# Patient Record
Sex: Male | Born: 2005 | Race: White | Hispanic: No | Marital: Single | Smoking: Never smoker
Health system: Southern US, Community
[De-identification: ages and names within clinical notes are randomized; demographics above are authoritative.]

## PROBLEM LIST (undated history)

## (undated) HISTORY — PX: APPENDECTOMY: SHX54

## (undated) HISTORY — PX: CYST REMOVAL TRUNK: SHX6283

## (undated) HISTORY — PX: ADENOIDECTOMY: SUR15

---
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
## 2023-02-20 ENCOUNTER — Ambulatory Visit: Payer: Medicaid Other | Admitting: Dermatology

## 2023-04-04 ENCOUNTER — Ambulatory Visit: Payer: Medicaid Other | Admitting: Dermatology

## 2023-04-04 ENCOUNTER — Ambulatory Visit (INDEPENDENT_AMBULATORY_CARE_PROVIDER_SITE_OTHER): Payer: Medicaid Other | Admitting: Dermatology

## 2023-04-04 VITALS — BP 144/86 | HR 76

## 2023-04-04 DIAGNOSIS — Z7189 Other specified counseling: Secondary | ICD-10-CM

## 2023-04-04 DIAGNOSIS — L700 Acne vulgaris: Secondary | ICD-10-CM

## 2023-04-04 DIAGNOSIS — Z79899 Other long term (current) drug therapy: Secondary | ICD-10-CM | POA: Diagnosis not present

## 2023-04-04 MED ORDER — ISOTRETINOIN 30 MG PO CAPS: 30.0000 mg | ORAL_CAPSULE | Freq: Every day | ORAL | 0 refills | Status: DC

## 2023-04-04 NOTE — Patient Instructions (Signed)
Due to recent changes in healthcare laws, you may see results of your pathology and/or laboratory studies on MyChart before the doctors have had a chance to review them. We understand that in some cases there may be results that are confusing or concerning to you. Please understand that not all results are received at the same time and often the doctors may need to interpret multiple results in order to provide you with the best plan of care or course of treatment. Therefore, we ask that you please give Jacob Simon 2 business days to thoroughly review all your results before contacting the office for clarification. Should we see a critical lab result, you will be contacted sooner.   If You Need Anything After Your Visit  If you have any questions or concerns for your doctor, please call our main line at [phone] and press option 4 to reach your doctor's medical assistant. If no one answers, please leave a voicemail as directed and we will return your call as soon as possible. Messages left after 4 pm will be answered the following business day.   You may also send Jacob Simon a message via MyChart. We typically respond to MyChart messages within 1-2 business days.  For prescription refills, please ask your pharmacy to contact our office. Our fax number is [phone].  If you have an urgent issue when the clinic is closed that cannot wait until the next business day, you can page your doctor at the number below.    Please note that while we do our best to be available for urgent issues outside of office hours, we are not available 24/7.   If you have an urgent issue and are unable to reach Jacob Simon, you may choose to seek medical care at your doctor's office, retail clinic, urgent care center, or emergency room.  If you have a medical emergency, please immediately call 911 or go to the emergency department.  Pager Numbers  - Dr. Gwen Pounds: [phone]  - Dr. Roseanne Reno: [phone]  In the event of inclement  weather, please call our main line at [phone] for an update on the status of any delays or closures.  Dermatology Medication Tips: Please keep the boxes that topical medications come in in order to help keep track of the instructions about where and how to use these. Pharmacies typically print the medication instructions only on the boxes and not directly on the medication tubes.   If your medication is too expensive, please contact our office at [phone] option 4 or send Jacob Simon a message through MyChart.   We are unable to tell what your co-pay for medications will be in advance as this is different depending on your insurance coverage. However, we may be able to find a substitute medication at lower cost or fill out paperwork to get insurance to cover a needed medication.   If a prior authorization is required to get your medication covered by your insurance company, please allow Jacob Simon 1-2 business days to complete this process.  Drug prices often vary depending on where the prescription is filled and some pharmacies may offer cheaper prices.  The website www.goodrx.com contains coupons for medications through different pharmacies. The prices here do not account for what the cost may be with help from insurance (it may be cheaper with your insurance), but the website can give you the price if you did not use any insurance.  - You can print the associated coupon and take it with your prescription to the pharmacy.  -  You may also stop by our office during regular business hours and pick up a GoodRx coupon card.  - If you need your prescription sent electronically to a different pharmacy, notify our office through [facility] or by phone at [phone] option 4.     Si Usted Necesita Algo Despus de Su Visita  Tambin puede enviarnos un mensaje a travs de Clinical cytogeneticist. Por lo general respondemos a los mensajes de MyChart en el transcurso de 1 a 2 das hbiles.  Para renovar recetas,  por favor pida a su farmacia que se ponga en contacto con nuestra oficina. Annie Sable de fax es [place] [phone].  Si tiene un asunto urgente cuando la clnica est cerrada y que no puede esperar hasta el siguiente da hbil, puede llamar/localizar a su doctor(a) al nmero que aparece a continuacin.   Por favor, tenga en cuenta que aunque hacemos todo lo posible para estar disponibles para asuntos urgentes fuera del horario de [place], no estamos disponibles las 24 horas del da, los 7 [address]  [address] de la [place].   Si tiene un problema urgente y no puede comunicarse con nosotros, puede optar por buscar atencin mdica  en el consultorio de su doctor(a), en una clnica privada, en un centro de atencin urgente o en una sala de emergencias.  Si tiene Engineer, drilling, por favor llame inmediatamente al 911 o vaya a la sala de emergencias.  Nmeros de bper  - Dr. Gwen Pounds: [phone]  - Dra. Moye: [phone]  - Dra. Roseanne Reno: [phone]  En caso de inclemencias del [place], por favor llame a Lacy Duverney principal al [phone] para una actualizacin sobre el [place] de cualquier retraso o cierre.  Consejos para la medicacin en dermatologa: Por favor, guarde las cajas en las que vienen los medicamentos de uso tpico para ayudarle a seguir las instrucciones sobre dnde y cmo usarlos. Las farmacias generalmente imprimen las instrucciones del medicamento slo en las cajas y no directamente en los tubos del [place].   Si su medicamento es muy caro, por favor, pngase en contacto con Rolm Gala llamando al [phone] y presione la opcin 4 o envenos un mensaje a travs de Clinical cytogeneticist.   No podemos decirle cul ser su copago por los medicamentos por adelantado ya que esto es diferente dependiendo de la cobertura de su seguro. Sin embargo, es posible que podamos encontrar un medicamento sustituto a Audiological scientist un formulario para que el seguro cubra el medicamento que se  considera necesario.   Si se requiere una autorizacin previa para que su compaa de seguros [place] su medicamento, por favor permtanos de 1 a 2 das hbiles para completar [address]  Los precios de los medicamentos varan con frecuencia dependiendo del Environmental consultant de dnde se surte la receta y alguna farmacias pueden ofrecer precios ms baratos.  El sitio web www.goodrx.com tiene cupones para medicamentos de Health and safety inspector. Los precios aqu no tienen en cuenta lo que podra costar con la ayuda del seguro (puede ser ms barato con su seguro), pero el sitio web puede darle el precio si no utiliz Tourist information centre manager.  - Puede imprimir el cupn correspondiente y llevarlo con su receta a la farmacia.  - Tambin puede pasar por nuestra oficina durante el horario de atencin regular y Education officer, museum una tarjeta de cupones de GoodRx.  - Si necesita que su receta se enve electrnicamente a Psychiatrist, informe a nuestra oficina a travs de MyChart de [place] o por telfono llamando al [phone] y  presione la opcin 4.

## 2023-04-04 NOTE — Progress Notes (Signed)
   Follow-Up Visit   Subjective  Jacob Simon is a 17 y.o. male who presents for the following: Acne Vulgaris - patient completed one month of Isotretinoin, but wasn't able to get his last prescription from [facility] so he has been off of the medication for about 5 months. He is here today to restart treatment for acne.    The following portions of the chart were reviewed this encounter and updated as appropriate: medications, allergies, medical history  Review of Systems:  No other skin or systemic complaints except as noted in HPI or Assessment and Plan.  Objective  Well appearing patient in no apparent distress; mood and affect are within normal limits.  Areas Examined: Face, chest and back  Relevant exam findings are noted in the Assessment and Plan.   Assessment & Plan   Acne vulgaris  Related Medications ISOtretinoin (ACCUTANE) 30 MG capsule Take 1 capsule (30 mg total) by mouth daily. Take with food   ACNE VULGARIS - completed 4 wks of Isotretinoin  Exam: Papules and comedones on the face, moderate papules on the back.   Chronic and persistent condition with duration or expected duration over one year. Condition is symptomatic/ bothersome to patient. Not currently at goal.   Treatment Plan: Restart Isotretinoin 30 mg po QD. Acne is Severe; chronic and persistent; not at goal. Patient is on Isotretinoin -  requiring FDA mandated monthly evaluations and laboratory monitoring.  While taking isotretinoin, do not share pills and do not donate blood. Generic isotretinoin is best absorbed when taken with a fatty meal. Isotretinoin can make you sensitive to the sun. Daily careful sun protection including sunscreen SPF 30+ when outdoors is recommended.  Return in about 1 month (around 05/05/2023).  Maylene Roes, CMA, am acting as scribe for Armida Sans, MD .  Documentation: I have reviewed the above documentation for accuracy and completeness, and I agree with the  above.  Armida Sans, MD

## 2023-04-10 ENCOUNTER — Encounter: Payer: Self-pay | Admitting: Dermatology

## 2023-05-08 ENCOUNTER — Ambulatory Visit: Payer: Medicaid Other | Admitting: Dermatology

## 2023-05-14 ENCOUNTER — Encounter: Payer: Self-pay | Admitting: Dermatology

## 2023-05-14 ENCOUNTER — Ambulatory Visit (INDEPENDENT_AMBULATORY_CARE_PROVIDER_SITE_OTHER): Payer: Medicaid Other | Admitting: Dermatology

## 2023-05-14 VITALS — BP 105/65 | HR 91 | Wt 160.0 lb

## 2023-05-14 DIAGNOSIS — L853 Xerosis cutis: Secondary | ICD-10-CM

## 2023-05-14 DIAGNOSIS — Z79899 Other long term (current) drug therapy: Secondary | ICD-10-CM

## 2023-05-14 DIAGNOSIS — L700 Acne vulgaris: Secondary | ICD-10-CM | POA: Diagnosis not present

## 2023-05-14 DIAGNOSIS — K130 Diseases of lips: Secondary | ICD-10-CM | POA: Diagnosis not present

## 2023-05-14 MED ORDER — ISOTRETINOIN 30 MG PO CAPS: ORAL_CAPSULE | ORAL | 0 refills | Status: DC

## 2023-05-14 NOTE — Progress Notes (Signed)
Isotretinoin Follow-Up Visit   Subjective  Jacob Simon is a 17 y.o. male who presents for the following: Isotretinoin follow-up - patient reports nose bleeds, dry skin, and chapped but no other side effects such as mood changes, depression, homicidal or suicidal thoughts.  Week # 8    Isotretinoin F/U - 05/14/23 1600       Isotretinoin Follow Up   iPledge # [id number]    Date 05/14/23    Weight 160 lb (72.6 kg)    Acne breakouts since last visit? Yes      Dosage   Target Dosage (mg) 10890    Current (To Date) Dosage (mg) 1500    To Go Dosage (mg) 9390      Skin Side Effects   Dry Lips Yes    Nose bleeds Yes    Dry eyes No    Dry Skin Yes    Sunburn No      Gastrointestinal Side Effects   Nausea No    Diarrhea No    Blood in stool No      Neurological Side Effects   Blurred vision No    Depression No    Headache No    Homicidal thoughts No    Mood Changes No    Suicidal thoughts No      Constitutional Side Effects   Fatigue No      Musculoskeletal Side Effects   Muscle aches No              Side effects: Dry skin, dry lips  The following portions of the chart were reviewed this encounter and updated as appropriate: medications, allergies, medical history  Review of Systems:  No other skin or systemic complaints except as noted in HPI or Assessment and Plan.  Objective  Well appearing patient in no apparent distress; mood and affect are within normal limits.  An examination of the face, neck, chest, and back was performed and relevant findings are noted below.     Assessment & Plan     ACNE VULGARIS Patient is currently on Isotretinoin requiring FDA mandated monthly evaluations and laboratory monitoring. Condition is currently not to goal (must reach target dose based on weight and also have clear skin for 2 months prior to discontinuation in order to help prevent relapse)  Exam findings: inflamed papules and pitted scars on cheeks.  Inflamed papules on upper back  Week # 8 Pharmacy [place], [place] #[id number]  Total mg -  1,500 Total mg/kg - 20.6  Increase Isotretinoin to 30mg  2 po QD.  Using aquaphor for epistaxis, cerave for moisturization Patient confirmed in iPledge and isotretinoin sent to pharmacy.  Patient will call us if side effects worsen  Xerosis secondary to isotretinoin therapy - Continue emollients as directed - Xyzal (levocetirizine) once a day and fish oil 1 gram daily may also help with dryness   Cheilitis secondary to isotretinoin therapy - Continue lip balm as directed, Dr. Clayborne Artist Cortibalm recommended   Long term medication management (isotretinoin).  Patient is using long term (months to years) prescription medication to control their dermatologic condition.  These medications require periodic monitoring to evaluate for efficacy and side effects and may require periodic laboratory monitoring.  - While taking Isotretinoin and for 30 days after you finish the medication, do not share pills, do not donate blood. It is very important that a women who could become pregnant not take this medicine or get a blood transfusion with this medicine in  it. Isotretinoin is best absorbed when taken with a fatty meal. Isotretinoin can make you sensitive to the sun. Daily careful sun protection including sunscreen SPF 30+ when outdoors is recommended.  Follow-up in 30 days.  Maylene Roes, CMA, am acting as scribe for Elie Goody, MD .  Documentation: I have reviewed the above documentation for accuracy and completeness, and I agree with the above.  Elie Goody, MD

## 2023-05-14 NOTE — Patient Instructions (Signed)

## 2023-06-14 ENCOUNTER — Ambulatory Visit: Payer: Medicaid Other | Admitting: Dermatology

## 2023-06-14 ENCOUNTER — Encounter: Payer: Self-pay | Admitting: Dermatology

## 2023-06-14 VITALS — Wt 160.0 lb

## 2023-06-14 DIAGNOSIS — L853 Xerosis cutis: Secondary | ICD-10-CM | POA: Diagnosis not present

## 2023-06-14 DIAGNOSIS — K130 Diseases of lips: Secondary | ICD-10-CM

## 2023-06-14 DIAGNOSIS — L700 Acne vulgaris: Secondary | ICD-10-CM | POA: Diagnosis not present

## 2023-06-14 DIAGNOSIS — Z79899 Other long term (current) drug therapy: Secondary | ICD-10-CM

## 2023-06-14 DIAGNOSIS — Z7189 Other specified counseling: Secondary | ICD-10-CM

## 2023-06-14 DIAGNOSIS — L237 Allergic contact dermatitis due to plants, except food: Secondary | ICD-10-CM | POA: Diagnosis not present

## 2023-06-14 MED ORDER — ISOTRETINOIN 30 MG PO CAPS: ORAL_CAPSULE | ORAL | 0 refills | Status: DC

## 2023-06-14 MED ORDER — TRIAMCINOLONE ACETONIDE 0.1 % EX CREA: TOPICAL_CREAM | CUTANEOUS | 0 refills | Status: AC

## 2023-06-14 NOTE — Patient Instructions (Addendum)
While taking isotretinoin, do not share pills and do not donate blood. Generic isotretinoin is best absorbed when taken with a fatty meal. Isotretinoin can make you sensitive to the sun. Daily careful sun protection including sunscreen SPF 30+ when outdoors is recommended.  For rash  Start triamcinolone 0.1 % cream - apply topically thin layer to rash areas twice daily as needed until clear    Topical steroids (such as triamcinolone, fluocinolone, fluocinonide, mometasone, clobetasol, halobetasol, betamethasone, hydrocortisone) can cause thinning and lightening of the skin if they are used for too long in the same area. Your physician has selected the right strength medicine for your problem and area affected on the body. Please use your medication only as directed by your physician to prevent side effects.     Due to recent changes in healthcare laws, you may see results of your pathology and/or laboratory studies on MyChart before the doctors have had a chance to review them. We understand that in some cases there may be results that are confusing or concerning to you. Please understand that not all results are received at the same time and often the doctors may need to interpret multiple results in order to provide you with the best plan of care or course of treatment. Therefore, we ask that you please give [place] 2 business days to thoroughly review all your results before contacting the office for clarification. Should we see a critical lab result, you will be contacted sooner.   If You Need Anything After Your Visit  If you have any questions or concerns for your doctor, please call our main line at [phone] and press option 4 to reach your doctor's medical assistant. If no one answers, please leave a voicemail as directed and we will return your call as soon as possible. Messages left after 4 pm will be answered the following business day.   You may also send [place] a message via MyChart. We  typically respond to MyChart messages within 1-2 business days.  For prescription refills, please ask your pharmacy to contact our office. Our fax number is [phone].  If you have an urgent issue when the clinic is closed that cannot wait until the next business day, you can page your doctor at the number below.    Please note that while we do our best to be available for urgent issues outside of office hours, we are not available 24/7.   If you have an urgent issue and are unable to reach [place], you may choose to seek medical care at your doctor's office, retail clinic, urgent care center, or emergency room.  If you have a medical emergency, please immediately call 911 or go to the emergency department.  Pager Numbers  - Dr. Gwen Pounds: [phone]  - Dr. Roseanne Reno: [phone]  - Dr. Katrinka Blazing: [phone]   In the event of inclement weather, please call our main line at [phone] for an update on the status of any delays or closures.  Dermatology Medication Tips: Please keep the boxes that topical medications come in in order to help keep track of the instructions about where and how to use these. Pharmacies typically print the medication instructions only on the boxes and not directly on the medication tubes.   If your medication is too expensive, please contact our office at [phone] option 4 or send [place] a message through MyChart.   We are unable to tell what your co-pay for medications will be in advance as this is different depending  on your insurance coverage. However, we may be able to find a substitute medication at lower cost or fill out paperwork to get insurance to cover a needed medication.   If a prior authorization is required to get your medication covered by your insurance company, please allow [place] 1-2 business days to complete this process.  Drug prices often vary depending on where the prescription is filled and some pharmacies may offer cheaper prices.  The  website www.goodrx.com contains coupons for medications through different pharmacies. The prices here do not account for what the cost may be with help from insurance (it may be cheaper with your insurance), but the website can give you the price if you did not use any insurance.  - You can print the associated coupon and take it with your prescription to the pharmacy.  - You may also stop by our office during regular business hours and pick up a GoodRx coupon card.  - If you need your prescription sent electronically to a different pharmacy, notify our office through [facility] or by phone at [phone] option 4.     Si Usted Necesita Algo Despus de Su Visita  Tambin puede enviarnos un mensaje a travs de Clinical cytogeneticist. Por lo general respondemos a los mensajes de MyChart en el transcurso de 1 a 2 das hbiles.  Para renovar recetas, por favor pida a su farmacia que se ponga en contacto con nuestra oficina. Annie Sable de fax es [place] [phone].  Si tiene un asunto urgente cuando la clnica est cerrada y que no puede esperar hasta el siguiente da hbil, puede llamar/localizar a su doctor(a) al nmero que aparece a continuacin.   Por favor, tenga en cuenta que aunque hacemos todo lo posible para estar disponibles para asuntos urgentes fuera del horario de [place], no estamos disponibles las 24 horas del da, los 7 [address]  [address] de la [place].   Si tiene un problema urgente y no puede comunicarse con nosotros, puede optar por buscar atencin mdica  en el consultorio de su doctor(a), en una clnica privada, en un centro de atencin urgente o en una sala de emergencias.  Si tiene Engineer, drilling, por favor llame inmediatamente al 911 o vaya a la sala de emergencias.  Nmeros de bper  - Dr. Gwen Pounds: [phone]  - Dra. Roseanne Reno: [phone]  - Dr. Katrinka Blazing: [phone]   En caso de inclemencias del tiempo, por favor llame a Lacy Duverney principal al [phone] para una  actualizacin sobre el [place] de cualquier retraso o cierre.  Consejos para la medicacin en dermatologa: Por favor, guarde las cajas en las que vienen los medicamentos de uso tpico para ayudarle a seguir las instrucciones sobre dnde y cmo usarlos. Las farmacias generalmente imprimen las instrucciones del medicamento slo en las cajas y no directamente en los tubos del [place].   Si su medicamento es muy caro, por favor, pngase en contacto con Rolm Gala llamando al [phone] y presione la opcin 4 o envenos un mensaje a travs de Clinical cytogeneticist.   No podemos decirle cul ser su copago por los medicamentos por adelantado ya que esto es diferente dependiendo de la cobertura de su seguro. Sin embargo, es posible que podamos encontrar un medicamento sustituto a Audiological scientist un formulario para que el seguro cubra el medicamento que se considera necesario.   Si se requiere una autorizacin previa para que su compaa de seguros [place] su medicamento, por favor permtanos de 1 a 2 das hbiles para [employer]  proceso.  Los precios de los medicamentos varan con frecuencia dependiendo del Environmental consultant de dnde se surte la receta y alguna farmacias pueden ofrecer precios ms baratos.  El sitio web www.goodrx.com tiene cupones para medicamentos de Health and safety inspector. Los precios aqu no tienen en cuenta lo que podra costar con la ayuda del seguro (puede ser ms barato con su seguro), pero el sitio web puede darle el precio si no utiliz Tourist information centre manager.  - Puede imprimir el cupn correspondiente y llevarlo con su receta a la farmacia.  - Tambin puede pasar por nuestra oficina durante el horario de atencin regular y Education officer, museum una tarjeta de cupones de GoodRx.  - Si necesita que su receta se enve electrnicamente a una farmacia diferente, informe a nuestra oficina a travs de MyChart de [place] o por telfono llamando al [phone] y presione la opcin 4.

## 2023-06-14 NOTE — Progress Notes (Signed)
Isotretinoin Follow-Up Visit   Subjective  Jacob Simon is a 17 y.o. male who presents for the following: Isotretinoin follow-up   Week # 12   Isotretinoin F/U - 06/14/23 1600       Isotretinoin Follow Up   iPledge # [id number]    Date 06/14/23    Weight 160 lb (72.6 kg)    Acne breakouts since last visit? No      Dosage   Target Dosage (mg) 10890    Current (To Date) Dosage (mg) 3300    To Go Dosage (mg) 7590      Skin Side Effects   Dry Lips No    Nose bleeds Yes    Dry eyes No    Dry Skin Yes    Sunburn No      Gastrointestinal Side Effects   Nausea No    Diarrhea No    Blood in stool No      Neurological Side Effects   Blurred vision No    Depression No    Headache No    Homicidal thoughts No    Mood Changes No    Suicidal thoughts No      Constitutional Side Effects   Fatigue No      Musculoskeletal Side Effects   Muscle aches No      Other Side Effects   Other Side Effects no              Side effects: Dry skin, dry lips  The following portions of the chart were reviewed this encounter and updated as appropriate: medications, allergies, medical history  Review of Systems:  No other skin or systemic complaints except as noted in HPI or Assessment and Plan.  Objective  Well appearing patient in no apparent distress; mood and affect are within normal limits.  An examination of the face, neck, chest, and back was performed and relevant findings are noted below.     Assessment & Plan     ACNE VULGARIS Patient is currently on Isotretinoin requiring FDA mandated monthly evaluations and laboratory monitoring. Condition is currently not to goal (must reach target dose based on weight and also have clear skin for 2 months prior to discontinuation in order to help prevent relapse)  Exam findings: Not many active spots at face  Week # 12 Pharmacy [facility] [place] [facility]GE #[id number] Total mg -  3300 Total mg/kg - 45  Continue  isotretinoin 30  2 po qd  Pending labs ALT, Triglycerides   Patient confirmed in iPledge and isotretinoin sent to pharmacy.    Xerosis secondary to isotretinoin therapy - Continue emollients as directed - Xyzal (levocetirizine) once a day and fish oil 1 gram daily may also help with dryness   Cheilitis secondary to isotretinoin therapy - Continue lip balm as directed, Dr. Clayborne Artist Cortibalm recommended   Long term medication management (isotretinoin).  Patient is using long term (months to years) prescription medication to control their dermatologic condition.  These medications require periodic monitoring to evaluate for efficacy and side effects and may require periodic laboratory monitoring.  - While taking Isotretinoin and for 30 days after you finish the medication, do not share pills, do not donate blood. It is very important that a women who could become pregnant not take this medicine or get a blood transfusion with this medicine in it. Isotretinoin is best absorbed when taken with a fatty meal. Isotretinoin can make you sensitive to the sun. Daily careful sun protection including  sunscreen SPF 30+ when outdoors is recommended.  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 Avoid  groin, and axilla. Use as directed. Long-term use can cause thinning of the skin and acne periorificial on the face  Topical steroids (such as triamcinolone, fluocinolone, fluocinonide, mometasone, clobetasol, halobetasol, betamethasone, hydrocortisone) can cause thinning and lightening of the skin if they are used for too long in the same area. Your physician has selected the right strength medicine for your problem and area affected on the body. Please use your medication only as directed by your physician to prevent side  effects.    Follow-up in 30 days.  I, Asher Muir, CMA, am acting as scribe for Elie Goody, MD.   Documentation: I have reviewed the above documentation for accuracy and completeness, and I agree with the above.  Elie Goody, MD

## 2023-07-16 ENCOUNTER — Encounter: Payer: Self-pay | Admitting: Dermatology

## 2023-07-16 ENCOUNTER — Ambulatory Visit (INDEPENDENT_AMBULATORY_CARE_PROVIDER_SITE_OTHER): Payer: Medicaid Other | Admitting: Dermatology

## 2023-07-16 VITALS — Wt 160.0 lb

## 2023-07-16 DIAGNOSIS — Z79899 Other long term (current) drug therapy: Secondary | ICD-10-CM | POA: Diagnosis not present

## 2023-07-16 DIAGNOSIS — Z7189 Other specified counseling: Secondary | ICD-10-CM | POA: Diagnosis not present

## 2023-07-16 DIAGNOSIS — L700 Acne vulgaris: Secondary | ICD-10-CM | POA: Diagnosis not present

## 2023-07-16 MED ORDER — ISOTRETINOIN 30 MG PO CAPS: 60.0000 mg | ORAL_CAPSULE | Freq: Every day | ORAL | 0 refills | Status: DC

## 2023-07-16 NOTE — Patient Instructions (Signed)

## 2023-07-16 NOTE — Progress Notes (Signed)
Isotretinoin Follow-Up Visit   Subjective  Jacob Simon is a 17 y.o. male who presents for the following: Isotretinoin follow-up  Week # 16   Isotretinoin F/U - 07/16/23 1600       Isotretinoin Follow Up   iPledge # [id number]    Date 07/17/23    Weight 160 lb (72.6 kg)    Acne breakouts since last visit? No      Dosage   Target Dosage (mg) 10890    Current (To Date) Dosage (mg) 5100    To Go Dosage (mg) 5790      Skin Side Effects   Dry Lips Yes    Nose bleeds No    Dry eyes No    Dry Skin No    Sunburn No      Gastrointestinal Side Effects   Nausea No    Diarrhea No    Blood in stool No      Neurological Side Effects   Blurred vision No    Depression No    Headache No    Homicidal thoughts No    Mood Changes Yes   quicker to get angry   Suicidal thoughts No      Constitutional Side Effects   Fatigue No      Musculoskeletal Side Effects   Muscle aches No              Side effects: Dry skin, dry lips  The following portions of the chart were reviewed this encounter and updated as appropriate: medications, allergies, medical history  Review of Systems:  No other skin or systemic complaints except as noted in HPI or Assessment and Plan.  Objective  Well appearing patient in no apparent distress; mood and affect are within normal limits.  An examination of the face, neck, chest, and back was performed and relevant findings are noted below.     Assessment & Plan   Acne vulgaris  Related Procedures Triglycerides ALT  Long-term use of high-risk medication  Counseling and coordination of care    ACNE VULGARIS Patient is currently on Isotretinoin requiring FDA mandated monthly evaluations and laboratory monitoring. Condition is currently not to goal (must reach target dose based on weight and also have clear skin for 2 months prior to discontinuation in order to help prevent relapse)  Exam findings: Few inflamed papules on right  cheek  Week # 16 Pharmacy [facility] [place] [facility] #[id number] Total mg -  5100 Total mg/kg - 70.24  Continue isotretinoin 60 mg daily. Will plan to increase dose pending labs; patient will get blood drawn next week  Patient confirmed in iPledge and isotretinoin sent to pharmacy.  Patient reports no mood changes, no depression or suicidal ideations. Patient knows to stop medication and contact us if mood changes  Xerosis secondary to isotretinoin therapy - Continue emollients as directed - Xyzal (levocetirizine) once a day and fish oil 1 gram daily may also help with dryness   Cheilitis secondary to isotretinoin therapy - Continue lip balm as directed, Dr. Clayborne Artist Cortibalm recommended   Long term medication management (isotretinoin).  Patient is using long term (months to years) prescription medication  to control their dermatologic condition.  These medications require periodic monitoring to evaluate for efficacy and side effects and may require periodic laboratory monitoring.  - While taking Isotretinoin and for 30 days after you finish the medication, do not share pills, do not donate blood. It is very important that a women who could become pregnant  not take this medicine or get a blood transfusion with this medicine in it. Isotretinoin is best absorbed when taken with a fatty meal. Isotretinoin can make you sensitive to the sun. Daily careful sun protection including sunscreen SPF 30+ when outdoors is recommended.  Follow-up in 30 days.  [name], RMA, am acting as scribe for Jacob Goody, MD .   Documentation: I have reviewed the above documentation for accuracy and completeness, and I agree with the above.  Jacob Goody, MD

## 2023-07-26 ENCOUNTER — Telehealth: Payer: Self-pay

## 2023-07-26 NOTE — Telephone Encounter (Signed)
LMOVM for patient's mother to have patient return our call. Do not have his number in our system.

## 2023-07-27 NOTE — Telephone Encounter (Signed)
Mom returned call but I was unable to give her any information because I don't know what we were calling about.

## 2023-07-30 NOTE — Telephone Encounter (Signed)
Patient has stopped taking Isotretinoin at this time.  He states the medication made him feel "bi polar".  Last pill taken four days ago.

## 2023-07-31 NOTE — Telephone Encounter (Signed)
Patient states he did not have any serious thoughts of harm. Just mood issues. He will keep 12/12/ appt to discuss alternative acne medications.

## 2023-08-16 ENCOUNTER — Ambulatory Visit (INDEPENDENT_AMBULATORY_CARE_PROVIDER_SITE_OTHER): Payer: Medicaid Other | Admitting: Dermatology

## 2023-08-16 DIAGNOSIS — T887XXA Unspecified adverse effect of drug or medicament, initial encounter: Secondary | ICD-10-CM

## 2023-08-16 DIAGNOSIS — L720 Epidermal cyst: Secondary | ICD-10-CM | POA: Diagnosis not present

## 2023-08-16 DIAGNOSIS — R454 Irritability and anger: Secondary | ICD-10-CM

## 2023-08-16 DIAGNOSIS — Z7189 Other specified counseling: Secondary | ICD-10-CM

## 2023-08-16 DIAGNOSIS — Z79899 Other long term (current) drug therapy: Secondary | ICD-10-CM

## 2023-08-16 DIAGNOSIS — L729 Follicular cyst of the skin and subcutaneous tissue, unspecified: Secondary | ICD-10-CM

## 2023-08-16 DIAGNOSIS — T50995A Adverse effect of other drugs, medicaments and biological substances, initial encounter: Secondary | ICD-10-CM | POA: Diagnosis not present

## 2023-08-16 DIAGNOSIS — L700 Acne vulgaris: Secondary | ICD-10-CM | POA: Diagnosis not present

## 2023-08-16 MED ORDER — ADAPALENE 0.3 % EX GEL: CUTANEOUS | 6 refills | Status: AC

## 2023-08-16 NOTE — Patient Instructions (Addendum)

## 2023-08-16 NOTE — Progress Notes (Signed)
   Follow-Up Visit   Subjective  Jacob Simon is a 17 y.o. male who presents for the following: Acne, pt was unable to tolerate 60 mg of Isotretinoin due to mood changes of anger. He stopped the medication about a month ago.   The following portions of the chart were reviewed this encounter and updated as appropriate: medications, allergies, medical history  Review of Systems:  No other skin or systemic complaints except as noted in HPI or Assessment and Plan.  Objective  Well appearing patient in no apparent distress; mood and affect are within normal limits.   A focused examination was performed of the following areas: the face, chest, and back   Relevant exam findings are noted in the Assessment and Plan.    Assessment & Plan   ACNE VULGARIS Exam: Open comedones and inflammatory papules on the chin  Chronic and persistent condition with duration or expected duration over one year. Condition is symptomatic/ bothersome to patient. Not currently at goal.   Treatment Plan: Start Adapalene 0.3% gel QHS. Topical retinoid medications like tretinoin/Retin-A, adapalene/Differin, tazarotene/Fabior, and Epiduo/Epiduo Forte can cause dryness and irritation when first started. Only apply a pea-sized amount to the entire affected area. Avoid applying it around the eyes, edges of mouth and creases at the nose. If you experience irritation, use a good moisturizer first and/or apply the medicine less often. If you are doing well with the medicine, you can increase how often you use it until you are applying every night. Be careful with sun protection while using this medication as it can make you sensitive to the sun. This medicine should not be used by pregnant women.   Do not recommend restarting Isotretinoin do to mood changes of anger issues.  EPIDERMAL INCLUSION CYST Exam: Subcutaneous nodule at the R dorsum prox penis 0.4 cm  Benign-appearing. Exam most consistent with an epidermal  inclusion cyst. Discussed that a cyst is a benign growth that can grow over time and sometimes get irritated or inflamed. Recommend observation if it is not bothersome. Discussed option of surgical excision to remove it if it is growing, symptomatic, or other changes noted. Please call for new or changing lesions so they can be evaluated.  Patient wants to pursue excision.  Reviewed procedure and advise he will likely have a scar area.  He is not concerned about the scar.  He wants to go ahead and schedule at his next follow-up.  Will schedule.   Return in about 6 months (around 02/14/2024) for surgery for cyst excision and acne follow up.  Maylene Roes, CMA, am acting as scribe for Armida Sans, MD .  Documentation: I have reviewed the above documentation for accuracy and completeness, and I agree with the above.  Armida Sans, MD

## 2023-08-19 ENCOUNTER — Encounter: Payer: Self-pay | Admitting: Dermatology

## 2023-09-17 ENCOUNTER — Ambulatory Visit: Payer: Medicaid Other | Admitting: Dermatology

## 2023-09-25 ENCOUNTER — Emergency Department: Payer: Self-pay

## 2023-09-25 ENCOUNTER — Encounter: Payer: Self-pay | Admitting: Emergency Medicine

## 2023-09-25 ENCOUNTER — Other Ambulatory Visit: Payer: Self-pay

## 2023-09-25 ENCOUNTER — Emergency Department
Admission: EM | Admit: 2023-09-25 | Discharge: 2023-09-25 | Disposition: A | Discharge status: Home / Self Care | Payer: Self-pay | Attending: Emergency Medicine | Admitting: Emergency Medicine

## 2023-09-25 DIAGNOSIS — Z23 Encounter for immunization: Secondary | ICD-10-CM | POA: Insufficient documentation

## 2023-09-25 DIAGNOSIS — Y990 Civilian activity done for income or pay: Secondary | ICD-10-CM | POA: Diagnosis not present

## 2023-09-25 DIAGNOSIS — W01118A Fall on same level from slipping, tripping and stumbling with subsequent striking against other sharp object, initial encounter: Secondary | ICD-10-CM | POA: Insufficient documentation

## 2023-09-25 DIAGNOSIS — S59911A Unspecified injury of right forearm, initial encounter: Secondary | ICD-10-CM | POA: Diagnosis present

## 2023-09-25 DIAGNOSIS — S51811A Laceration without foreign body of right forearm, initial encounter: Secondary | ICD-10-CM | POA: Insufficient documentation

## 2023-09-25 MED ORDER — LIDOCAINE-EPINEPHRINE-TETRACAINE (LET) TOPICAL GEL
3.0000 mL | Freq: Once | TOPICAL | Status: AC
Start: 2023-09-25 — End: 2023-09-25
  Administered 2023-09-25: 3 mL via TOPICAL
  Filled 2023-09-25: qty 3

## 2023-09-25 MED ORDER — TETANUS-DIPHTH-ACELL PERTUSSIS 5-2.5-18.5 LF-MCG/0.5 IM SUSY
0.5000 mL | PREFILLED_SYRINGE | Freq: Once | INTRAMUSCULAR | Status: AC
  Administered 2023-09-25: 0.5 mL via INTRAMUSCULAR
  Filled 2023-09-25: qty 0.5

## 2023-09-25 MED ORDER — LIDOCAINE HCL (PF) 1 % IJ SOLN
5.0000 mL | Freq: Once | INTRAMUSCULAR | Status: AC
  Administered 2023-09-25: 5 mL via INTRADERMAL
  Filled 2023-09-25: qty 5

## 2023-09-25 NOTE — ED Provider Notes (Signed)
[facility] Provider Note    Event Date/Time   First MD Initiated Contact with Patient 09/25/23 1037     (approximate)   History   Laceration   HPI Jacob Simon is a 18 y.o. male with no significant past medical history presents emergency department with laceration to the right forearm.  Patient was at work and fell through a deck.  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  Laceration is on the right forearm.  Patient states that hit a piece of sheet metal.  Unsure of his last Tdap      Physical Exam   Triage Vital Signs: ED Triage Vitals  Encounter Vitals Group     BP 09/25/23 1009 (!) 143/85     Systolic BP Percentile --      Diastolic BP Percentile --      Pulse Rate 09/25/23 1009 (!) 106     Resp 09/25/23 1009 17     Temp 09/25/23 1009 98.1 F (36.7 C)     Temp Source 09/25/23 1009 Oral     SpO2 09/25/23 1009 99 %     Weight 09/25/23 1206 160 lb 0.9 oz (72.6 kg)     Height 09/25/23 1206 6\' 2"  (1.88 m)     Head Circumference --      Peak Flow --      Pain Score 09/25/23 1009 5     Pain Loc --      Pain Education --      Exclude from Growth Chart --     Most recent vital signs: Vitals:   09/25/23 1009  BP: (!) 143/85  Pulse: (!) 106  Resp: 17  Temp: 98.1 F (36.7 C)  SpO2: 99%     General: Awake, no distress.   CV:  Good peripheral perfusion. regular rate and  rhythm Resp:  Normal effort.  Abd:  No distention.   Other:  8 cm laceration noted to the volar surface of the right forearm, no foreign body, neurovascular intact   ED Results / Procedures / Treatments   Labs (all labs ordered are listed, but only abnormal results are displayed) Labs Reviewed - No data to display   EKG     RADIOLOGY X-ray of the right forearm    PROCEDURES:   .Laceration Repair  Date/Time: 09/25/2023 1:34 PM  Performed by: Faythe Ghee, PA-C Authorized by: Faythe Ghee, PA-C   Consent:    Consent obtained:  Verbal    Consent given by:  Patient   Risks, benefits, and alternatives were discussed: yes     Risks discussed:  Infection, pain, retained foreign body, need for additional repair, poor cosmetic result, tendon damage, nerve damage, poor wound healing and vascular damage   Alternatives discussed:  No treatment Universal protocol:    Relevant documents present and verified: yes     Immediately prior to procedure, a time out was called: yes     Patient identity confirmed:  Verbally with patient Anesthesia:    Anesthesia method:  Topical application and local infiltration   Topical anesthetic:  LET   Local anesthetic:  Lidocaine 1% w/o epi Laceration details:    Location:  Shoulder/arm   Shoulder/arm location:  R lower arm   Length (cm):  8 Pre-procedure details:    Preparation:  Patient was prepped and draped in usual sterile fashion and imaging obtained to evaluate for foreign bodies Exploration:    Hemostasis achieved with:  LET  Imaging obtained: x-ray     Imaging outcome: foreign body not noted     Wound exploration: wound explored through full range of motion and entire depth of wound visualized     Wound extent: areolar tissue not violated, fascia not violated, no foreign body, no signs of injury, no nerve damage, no tendon damage, no underlying fracture and no vascular damage     Contaminated: no   Treatment:    Area cleansed with:  Povidone-iodine and saline   Amount of cleaning:  Standard   Irrigation solution:  Sterile saline   Irrigation method:  Tap   Debridement:  None   Undermining:  None   Scar revision: no   Skin repair:    Repair method:  Sutures   Suture size:  5-0   Suture material:  Nylon   Suture technique:  Simple interrupted   Number of sutures:  16 Approximation:    Approximation:  Close Repair type:    Repair type:  Simple Post-procedure details:    Dressing:  Non-adherent dressing   Procedure completion:  Tolerated well, no immediate  complications    MEDICATIONS ORDERED IN ED: Medications  lidocaine-EPINEPHrine-tetracaine (LET) topical gel (3 mLs Topical Given by Other 09/25/23 1226)  lidocaine (PF) (XYLOCAINE) 1 % injection 5 mL (5 mLs Intradermal Given by Other 09/25/23 1226)  Tdap (BOOSTRIX) injection 0.5 mL (0.5 mLs Intramuscular Given 09/25/23 1235)     IMPRESSION / MDM / ASSESSMENT AND PLAN / ED COURSE  I reviewed the triage vital signs and the nursing notes.                              Differential diagnosis includes, but is not limited to, laceration, fracture, contusion, tendon injury, nerve injury  Patient's presentation is most consistent with acute illness / injury with system symptoms.   X-ray of the right forearm independently reviewed interpreted by me as being negative for any acute abnormality  See procedure note for laceration repair  No foreign body or tendon injury noted, patient is neurovascularly intact   Patient Toller procedure well.  Work note was provided.  He is to return the emergency department, go to urgent care or see his regular doctor in 7 to 10 days for suture removal.  Patient is in agreement treatment plan.  He is discharged stable condition.   FINAL CLINICAL IMPRESSION(S) / ED DIAGNOSES   Final diagnoses:  Laceration of right forearm, initial encounter     Rx / DC Orders   ED Discharge Orders     None        Note:  This document was prepared using Dragon voice recognition software and may include unintentional dictation errors.    Faythe Ghee, PA-C 09/25/23 1336    Corena Herter, MD 09/25/23 [id number]

## 2023-09-25 NOTE — ED Notes (Signed)
See triage note  Presents with laceration to right arm  States he fell   hitting sheet metal   Large laceration noted to forearm

## 2023-09-25 NOTE — ED Triage Notes (Signed)
Pt here with a laceration on his right arm. Pt was working and fell 4 feet down and cut his arm on a piece of sheet metal. Pt has right arm wrapped in gauze and electric tape, bleeding controlled. Pt still able to move fingers on affected arm.

## 2023-10-06 ENCOUNTER — Ambulatory Visit: Payer: Self-pay

## 2023-10-06 ENCOUNTER — Ambulatory Visit
Admission: EM | Admit: 2023-10-06 | Discharge: 2023-10-06 | Disposition: A | Discharge status: Home / Self Care | Payer: Medicaid Other

## 2023-10-06 DIAGNOSIS — Z4802 Encounter for removal of sutures: Secondary | ICD-10-CM | POA: Diagnosis not present

## 2023-10-06 NOTE — Discharge Instructions (Addendum)
16 sutures placed, you removed 2 and we removed 14  As we were removing her sutures your wound began to open up probably due to to its death  Tissue adhesive helps been placed to keep wound closed until it heals on its own, do not peel off and allow it to fall off on its own  Cleanse area intentionally at least once a day, use unscented soap and water, pat and do not rub  Cover area with a nonadherent dressing if activity puts this at risk for becoming dirty or your clothing is causing irritation  If you have any further concerns regarding healing please follow-up with urgent care or your primary doctor

## 2023-10-06 NOTE — ED Triage Notes (Signed)
Here to remove stitches. Once removing the place was still open some so left 6 stitches. NP at bedside

## 2023-10-06 NOTE — ED Provider Notes (Signed)
Jacob Simon    CSN: [id number] Arrival date & time: 10/06/23  1241      History   Chief Complaint Chief Complaint  Patient presents with   Suture / Staple Removal    HPI Jacob Simon is a 18 y.o. male.   Patient presents for evaluation of suture removal, in place for 12 days.  Attempted to remove his self, after removal of 2 wound started to open up with therefore concerned with healing.  Denies worsening erythema, swelling, pain or drainage.  No past medical history on file.  Patient Active Problem List   Diagnosis Date Noted   Acute appendicitis with localized peritonitis 10/23/2020    Past Surgical History:  Procedure Laterality Date   ADENOIDECTOMY     XI ROBOTIC LAPAROSCOPIC ASSISTED APPENDECTOMY N/A 10/23/2020   Procedure: XI ROBOTIC LAPAROSCOPIC ASSISTED APPENDECTOMY;  Surgeon: Carolan Shiver, MD;  Location: [facility] ORS;  Service: General;  Laterality: N/A;       Home Medications    Prior to Admission medications   Medication Sig Start Date End Date Taking? Authorizing Provider  Adapalene (DIFFERIN) 0.3 % gel Apply a pea sized amount to the face QHS. 08/16/23   Deirdre Evener, MD  Dapsone (ACZONE) 7.5 % GEL Apply 1 application topically every morning. Qam to acne on face and shoulders Patient not taking: Reported on 08/16/2023 10/20/21   Deirdre Evener, MD  erythromycin with ethanol [facility]) 2 % external solution Apply topically in the morning. Patient not taking: Reported on 08/16/2023 06/29/21   Deirdre Evener, MD  oxyCODONE (OXY IR/ROXICODONE) 5 MG immediate release tablet Take 1 tablet (5 mg total) by mouth every 4 (four) hours as needed for severe pain or moderate pain. Patient not taking: Reported on 08/16/2023 10/24/20   Carolan Shiver, MD  triamcinolone cream (KENALOG) 0.1 % Apply thin layer topically to rash at face and arms twice daily prn until clear. Avoid applying  groin, and axilla. Use as directed. Patient not  taking: Reported on 08/16/2023 06/14/23   Elie Goody, MD    Family History No family history on file.  Social History Social History   Tobacco Use   Smoking status: Never   Smokeless tobacco: Never  Substance Use Topics   Alcohol use: Never   Drug use: Never     Allergies   Patient has no known allergies.   Review of Systems Review of Systems   Physical Exam Triage Vital Signs ED Triage Vitals  Encounter Vitals Group     BP 10/06/23 1304 121/74     Systolic BP Percentile --      Diastolic BP Percentile --      Pulse Rate 10/06/23 1304 78     Resp 10/06/23 1304 16     Temp 10/06/23 1304 98.1 F (36.7 C)     Temp Source 10/06/23 1304 Oral     SpO2 10/06/23 1304 100 %     Weight --      Height --      Head Circumference --      Peak Flow --      Pain Score 10/06/23 1303 3     Pain Loc --      Pain Education --      Exclude from Growth Chart --    No data found.  Updated Vital Signs BP 121/74 (BP Location: Left Arm)   Pulse 78   Temp 98.1 F (36.7 C) (Oral)   Resp 16  SpO2 100%   Visual Acuity Right Eye Distance:   Left Eye Distance:   Bilateral Distance:    Right Eye Near:   Left Eye Near:    Bilateral Near:     Physical Exam Constitutional:      Appearance: Normal appearance.  Eyes:     Extraocular Movements: Extraocular movements intact.  Pulmonary:     Effort: Pulmonary effort is normal.  Skin:    Comments: 8 cm laceration present to the anterior of the right forearm with 14 sutures in place, no erythema, swelling or drainage present on exam  Neurological:     Mental Status: He is alert and oriented to person, place, and time. Mental status is at baseline.      UC Treatments / Results  Labs (all labs ordered are listed, but only abnormal results are displayed) Labs Reviewed - No data to display  EKG   Radiology No results found.  Procedures Procedures (including critical care time)  Medications Ordered in  UC Medications - No data to display  Initial Impression / Assessment and Plan / UC Course  I have reviewed the triage vital signs and the nursing notes.  Pertinent labs & imaging results that were available during my care of the patient were reviewed by me and considered in my medical decision making (see chart for details).  Visit for suture removal  Upon removal of 7 sutures wound began today dehisce, all 14 sutures removal and then wound reviewed here with tissue adhesive, advised daily cleansin, covering with a nonadherent dressing if at risk for infection and monitoring for any further concerns may follow-up with the primary Final Clinical Impressions(s) / UC Diagnoses   Final diagnoses:  Visit for suture removal     Discharge Instructions      16 sutures placed, you removed 2 and we removed 14  As we were removing her sutures your wound began to open up probably due to to its death  Tissue adhesive helps been placed to keep wound closed until it heals on its own, do not peel off and allow it to fall off on its own  Cleanse area intentionally at least once a day, use unscented soap and water, pat and do not rub  Cover area with a nonadherent dressing if activity puts this at risk for becoming dirty or your clothing is causing irritation  If you have any further concerns regarding healing please follow-up with urgent care or your primary doctor   ED Prescriptions   None    PDMP not reviewed this encounter.   Valinda Hoar, NP 10/06/23 1421

## 2023-10-23 ENCOUNTER — Telehealth: Payer: Self-pay

## 2023-10-23 DIAGNOSIS — L729 Follicular cyst of the skin and subcutaneous tissue, unspecified: Secondary | ICD-10-CM

## 2023-10-23 NOTE — Telephone Encounter (Signed)
 This patient was scheduled for a cyst removal with Dr. Gwen Pounds in June. Can someone please review 08/2023 office note and determine if excision can be done in office with another provider?

## 2023-10-24 NOTE — Telephone Encounter (Signed)
 Referral has been sent and patient advised. aw

## 2023-12-13 ENCOUNTER — Encounter: Payer: Self-pay | Admitting: Dermatology

## 2023-12-19 ENCOUNTER — Encounter: Payer: Self-pay | Admitting: Dermatology

## 2023-12-19 ENCOUNTER — Ambulatory Visit (INDEPENDENT_AMBULATORY_CARE_PROVIDER_SITE_OTHER): Admitting: Dermatology

## 2023-12-19 DIAGNOSIS — L720 Epidermal cyst: Secondary | ICD-10-CM

## 2023-12-19 DIAGNOSIS — D492 Neoplasm of unspecified behavior of bone, soft tissue, and skin: Secondary | ICD-10-CM

## 2023-12-19 DIAGNOSIS — D485 Neoplasm of uncertain behavior of skin: Secondary | ICD-10-CM

## 2023-12-19 NOTE — Progress Notes (Signed)
 Follow-Up Visit   Subjective  Jacob Simon is a 18 y.o. male who presents for the following: Excision of a neoplasm of skin on the penile shaft, referred by Dr. Gwen Pounds. Patient states that lesion has been present for years.   The following portions of the chart were reviewed this encounter and updated as appropriate: medications, allergies, medical history  Review of Systems:  No other skin or systemic complaints except as noted in HPI or Assessment and Plan.  Objective  Well appearing patient in no apparent distress; mood and affect are within normal limits.  A focused examination was performed of the following areas:  Right groin   Relevant physical exam findings are noted in the Assessment and Plan.   Right Inguinal Area Subcutaneous nodule   Assessment & Plan   NEOPLASM OF UNCERTAIN BEHAVIOR OF SKIN Right Inguinal Area Skin excision  Excision method:  elliptical Lesion length (cm):  1 Lesion width (cm):  1.1 Margin per side (cm):  0.1 Total excision diameter (cm):  1.3 Informed consent: discussed and consent obtained   Timeout: patient name, date of birth, surgical site, and procedure verified   Procedure prep:  Patient was prepped and draped in usual sterile fashion Prep type:  Chlorhexidine Anesthesia: the lesion was anesthetized in a standard fashion   Anesthetic:  1% lidocaine w/ epinephrine 1-100,000 buffered w/ 8.4% NaHCO3 Instrument used: #15 blade   Hemostasis achieved with: suture, pressure and electrodesiccation   Outcome: patient tolerated procedure well with no complications   Post-procedure details: sterile dressing applied and wound care instructions given   Dressing type: petrolatum, bandage and pressure dressing    Skin repair Complexity:  Complex Final length (cm):  2.6 Informed consent: discussed and consent obtained   Timeout: patient name, date of birth, surgical site, and procedure verified   Procedure prep:  Patient was prepped and  draped in usual sterile fashion Prep type:  Chlorhexidine Anesthesia: the lesion was anesthetized in a standard fashion   Anesthetic:  1% lidocaine w/ epinephrine 1-100,000 buffered w/ 8.4% NaHCO3 Reason for type of repair: reduce tension to allow closure and preserve normal anatomy   Undermining: area extensively undermined   Subcutaneous layers (deep stitches):  Suture size:  5-0 Suture type: Monocryl (poliglecaprone 25)   Stitches:  Buried vertical mattress Fine/surface layer approximation (top stitches):  Suture size:  6-0 Suture type: fast-absorbing plain gut and cyanoacrylate tissue glue   Stitches: simple running   Suture removal (days):  0 Hemostasis achieved with: suture, pressure and electrodesiccation Outcome: patient tolerated procedure well with no complications   Post-procedure details: sterile dressing applied and wound care instructions given   Dressing type: petrolatum, bandage and pressure dressing   Specimen 1 - Surgical pathology Differential Diagnosis: r/o cyst vs other  Check Margins: yes  The surgical wound was then cleaned, prepped, and re-anesthetized as above. Wound edges were undermined extensively along at least one entire edge and at a distance equal to or greater than the width of the defect (see wound defect size above) in order to achieve closure and decrease wound tension and anatomic distortion. Redundant tissue repair including standing cone removal was performed. Hemostasis was achieved with electrocautery. Subcutaneous and epidermal tissues were approximated with the above sutures. The surgical site was then lightly scrubbed with sterile, saline-soaked gauze. The area was then bandaged using Vaseline ointment, non-adherent gauze, gauze pads, and tape to provide an adequate pressure dressing. The patient tolerated the procedure well, was given detailed written and  verbal wound care instructions, and was discharged in good condition.   The patient will  follow-up: PRN.  Return if symptoms worsen or fail to improve.  I, Haig Levan, Surg Tech III, am acting as scribe for Deneise Finlay, MD.   Documentation: I have reviewed the above documentation for accuracy and completeness, and I agree with the above.  Deneise Finlay, MD

## 2023-12-25 LAB — SURGICAL PATHOLOGY

## 2024-02-04 ENCOUNTER — Encounter: Payer: Medicaid Other | Admitting: Dermatology

## 2024-02-05 ENCOUNTER — Encounter: Payer: Medicaid Other | Admitting: Dermatology

## 2024-02-07 ENCOUNTER — Ambulatory Visit (INDEPENDENT_AMBULATORY_CARE_PROVIDER_SITE_OTHER): Admitting: Nurse Practitioner

## 2024-02-07 ENCOUNTER — Ambulatory Visit
Admission: RE | Admit: 2024-02-07 | Discharge: 2024-02-07 | Disposition: A | Discharge status: Home / Self Care | Source: Ambulatory Visit | Attending: Nurse Practitioner | Admitting: Nurse Practitioner

## 2024-02-07 ENCOUNTER — Ambulatory Visit
Admission: RE | Admit: 2024-02-07 | Discharge: 2024-02-07 | Disposition: A | Discharge status: Home / Self Care | Attending: Nurse Practitioner | Admitting: Nurse Practitioner

## 2024-02-07 VITALS — BP 116/68 | HR 69 | Temp 98.0°F | Ht 74.5 in | Wt 168.7 lb

## 2024-02-07 DIAGNOSIS — Z7689 Persons encountering health services in other specified circumstances: Secondary | ICD-10-CM

## 2024-02-07 DIAGNOSIS — G8929 Other chronic pain: Secondary | ICD-10-CM | POA: Diagnosis present

## 2024-02-07 DIAGNOSIS — Z13220 Encounter for screening for lipoid disorders: Secondary | ICD-10-CM

## 2024-02-07 DIAGNOSIS — M545 Low back pain, unspecified: Secondary | ICD-10-CM | POA: Insufficient documentation

## 2024-02-07 DIAGNOSIS — Z130 Encounter for screening for diseases of the blood and blood-forming organs and certain disorders involving the immune mechanism: Secondary | ICD-10-CM

## 2024-02-07 DIAGNOSIS — Z114 Encounter for screening for human immunodeficiency virus [HIV]: Secondary | ICD-10-CM

## 2024-02-07 DIAGNOSIS — M542 Cervicalgia: Secondary | ICD-10-CM | POA: Insufficient documentation

## 2024-02-07 DIAGNOSIS — Z131 Encounter for screening for diabetes mellitus: Secondary | ICD-10-CM

## 2024-02-07 DIAGNOSIS — Z1159 Encounter for screening for other viral diseases: Secondary | ICD-10-CM

## 2024-02-07 MED ORDER — CELECOXIB 100 MG PO CAPS: 100.0000 mg | ORAL_CAPSULE | Freq: Two times a day (BID) | ORAL | 1 refills | Status: AC

## 2024-02-07 NOTE — Progress Notes (Signed)
 BP 116/68 (BP Location: Right Arm, Patient Position: Sitting, Cuff Size: Normal)   Pulse 69   Temp 98 F (36.7 C) (Oral)   Ht 6' 2.5" (1.892 m)   Wt 168 lb 11.2 oz (76.5 kg)   SpO2 99%   BMI 21.37 kg/m    Subjective:    Patient ID: Jacob Simon, male    DOB: Jan 06, 2006, 18 y.o.   MRN: [id number]  HPI: Jacob Simon is a 18 y.o. male  Chief Complaint  Patient presents with   Establish Care   Back Pain    Lower and near neck, constant, onset 1 year    Discussed the use of AI scribe software for clinical note transcription with the patient, who gave verbal consent to proceed.  History of Present Illness Jacob Simon is a 18 year old male who presents with chronic back pain.  He has been experiencing chronic back pain for approximately one year, located in the lower back and extending to the top of the spine and neck. The pain is constant and annoying, occasionally radiating to the right side but not the left. No numbness or tingling in the arms or legs. No issues with urination or bowel movements.  He attributes the onset of his pain to rapid growth and prolonged work hours, as he works ten hours a day. He has not sought chiropractic care or physical therapy in the past. Tylenol  has been used for pain relief, providing minimal benefit. No other medications have been used for this issue.         02/07/2024    2:20 PM  Depression screen PHQ 2/9  Decreased Interest 0  Down, Depressed, Hopeless 0  PHQ - 2 Score 0  Altered sleeping 0  Tired, decreased energy 0  Change in appetite 0  Feeling bad or failure about yourself  0  Trouble concentrating 0  Moving slowly or fidgety/restless 0  Suicidal thoughts 0  PHQ-9 Score 0  Difficult doing work/chores Not difficult at all    Relevant past medical, surgical, family and social history reviewed and updated as indicated. Interim medical history since our last visit reviewed. Allergies and medications reviewed and updated.  Review  of Systems  Ten systems reviewed and is negative except as mentioned in HPI      Objective:      BP 116/68 (BP Location: Right Arm, Patient Position: Sitting, Cuff Size: Normal)   Pulse 69   Temp 98 F (36.7 C) (Oral)   Ht 6' 2.5" (1.892 m)   Wt 168 lb 11.2 oz (76.5 kg)   SpO2 99%   BMI 21.37 kg/m    Wt Readings from Last 3 Encounters:  02/07/24 168 lb 11.2 oz (76.5 kg)  09/25/23 160 lb 0.9 oz (72.6 kg) (58%, Z= 0.19)*  07/16/23 160 lb (72.6 kg) (58%, Z= 0.21)*   * Growth percentiles are based on CDC (Boys, 2-20 Years) data.    Physical Exam Vitals reviewed.  Constitutional:      Appearance: Normal appearance.  HENT:     Head: Normocephalic.  Cardiovascular:     Rate and Rhythm: Normal rate and regular rhythm.  Pulmonary:     Effort: Pulmonary effort is normal.     Breath sounds: Normal breath sounds.  Musculoskeletal:        General: Normal range of motion.     Cervical back: Tenderness present.     Lumbar back: Tenderness present.  Skin:    General: Skin is warm  and dry.  Neurological:     General: No focal deficit present.     Mental Status: He is alert and oriented to person, place, and time. Mental status is at baseline.  Psychiatric:        Mood and Affect: Mood normal.        Behavior: Behavior normal.        Thought Content: Thought content normal.        Judgment: Judgment normal.        Results for orders placed or performed in visit on 12/19/23  Surgical pathology   Collection Time: 12/19/23 12:00 AM  Result Value Ref Range   SURGICAL PATHOLOGY      SURGICAL PATHOLOGY [facility] [address] [place], [place] [address] Telephone [phone] or [phone] Fax [phone]  REPORT OF DERMATOPATHOLOGY   Accession #: [id number] Patient Name: Jacob, Simon Visit # : [id number]  MRN: [id number] Cytotechnologist: Jacob Simon, Dermatopathologist, Electronic Signature DOB/Age Sep 18, 2005  (Age: 68) Gender: M Collected Date: 12/19/2023 Received Date: 12/19/2023  FINAL DIAGNOSIS       1. Skin (M), right inguinal area :       EPIDERMAL INCLUSION CYST, LIMITED MARGINS FREE       DATE SIGNED OUT: 12/25/2023 ELECTRONIC SIGNATURE : Jacob Simon, Dermatopathologist, Electronic Signature  MICROSCOPIC DESCRIPTION 1. There is a squamous epithelial lined cyst with  at least a partial granular layer. Keratin material in a layered pattern is evident.  On limited margin evaluation, the edges appear free.  CASE COMMENTS STAINS USED IN DIAGNOSIS: H&E    CLINI CAL HISTORY  SPECIMEN(S) OBTAINED 1. Skin (M), Right Inguinal Area  SPECIMEN COMMENTS: 1. Check margins SPECIMEN CLINICAL INFORMATION: 1. Neoplasm of uncertain behavior of skin, R/O cyst vs other    Gross Description 1. Shape:  Includes limited fat, Elliptical      Size:  17 x 10 x 4 mm      Lesion:  Clinically mentioned cyst observed      Orientation:  None; all margins inked      Block Summary: Specimen has been submitted in representative sections with 3      cross sections in cassette A. (alb)        Report signed out from the following location(s) [place] [address], [place] [address] CLIA #: [id number]  [facility] [address] [place], [place] [address] CLIA #: [id number]           Assessment & Plan:   Problem List Items Addressed This Visit   None Visit Diagnoses       Encounter to establish care    -  Primary     Screening for deficiency anemia       Relevant Orders   CBC with Differential/Platelet     Screening for cholesterol level       Relevant Orders   Lipid panel     Screening for HIV without presence of risk factors       Relevant Orders   HIV Antibody (routine testing w rflx)     Screening for diabetes mellitus       Relevant Orders   Comprehensive metabolic panel with GFR   Hemoglobin A1c      Encounter for hepatitis C screening test for low risk patient       Relevant Orders   Hepatitis C antibody     Chronic bilateral  low 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 No associated numbness, tingling, or urinary/bowel issues. Tylenol  provides minimal relief. He has not sought chiropractic or physical therapy care. - Prescribe ibuprofen twice daily for at least one week to assess effectiveness. - Order x-rays to evaluate the spine and rule out other underlying conditions. -  referral to a chiropractor   General Health Maintenance Establishing care and obtaining baseline health information. - Order baseline blood work to assess general health status.  Follow-up Instructions provided for obtaining x-rays and blood work. - Instruct him to go to the outpatient imaging center for x-rays without an appointment.        Follow up plan: Return in about 1 year (around 02/06/2025) for cpe.

## 2024-02-08 ENCOUNTER — Ambulatory Visit: Payer: Self-pay | Admitting: Nurse Practitioner

## 2024-02-08 LAB — COMPREHENSIVE METABOLIC PANEL WITH GFR
AG Ratio: 2.5 (calc) (ref 1.0–2.5)
ALT: 12 U/L (ref 9–46)
AST: 18 U/L (ref 10–40)
Albumin: 5 g/dL (ref 3.6–5.1)
Alkaline phosphatase (APISO): 51 U/L (ref 36–130)
BUN: 10 mg/dL (ref 7–25)
CO2: 26 mmol/L (ref 20–32)
Calcium: 9.6 mg/dL (ref 8.6–10.3)
Chloride: 103 mmol/L (ref 98–110)
Creat: 1.03 mg/dL (ref 0.60–1.24)
Globulin: 2 g/dL (ref 1.9–3.7)
Glucose, Bld: 85 mg/dL (ref 65–99)
Potassium: 4.3 mmol/L (ref 3.5–5.3)
Sodium: 139 mmol/L (ref 135–146)
Total Bilirubin: 0.9 mg/dL (ref 0.2–1.2)
Total Protein: 7 g/dL (ref 6.1–8.1)
eGFR: 107 mL/min/{1.73_m2} (ref >=60)

## 2024-02-08 LAB — CBC WITH DIFFERENTIAL/PLATELET
Absolute Lymphocytes: 2001 {cells}/uL (ref 850–3900)
Absolute Monocytes: 331 {cells}/uL (ref 200–950)
Basophils Absolute: 32 {cells}/uL (ref 0–200)
Basophils Relative: 0.7 %
Eosinophils Absolute: 51 {cells}/uL (ref 15–500)
Eosinophils Relative: 1.1 %
HCT: 45.6 % (ref 38.5–50.0)
Hemoglobin: 15.2 g/dL (ref 13.2–17.1)
MCH: 29.5 pg (ref 27.0–33.0)
MCHC: 33.3 g/dL (ref 32.0–36.0)
MCV: 88.4 fL (ref 80.0–100.0)
MPV: 9.5 fL (ref 7.5–12.5)
Monocytes Relative: 7.2 %
Neutro Abs: 2185 {cells}/uL (ref 1500–7800)
Neutrophils Relative %: 47.5 %
Platelets: 234 10*3/uL (ref 140–400)
RBC: 5.16 10*6/uL (ref 4.20–5.80)
RDW: 12.5 % (ref 11.0–15.0)
Total Lymphocyte: 43.5 %
WBC: 4.6 10*3/uL (ref 3.8–10.8)

## 2024-02-08 LAB — HEMOGLOBIN A1C
Hgb A1c MFr Bld: 5.2 % (ref <5.7)
Mean Plasma Glucose: 103 mg/dL
eAG (mmol/L): 5.7 mmol/L

## 2024-02-08 LAB — HIV ANTIBODY (ROUTINE TESTING W REFLEX): HIV 1&2 Ab, 4th Generation: NONREACTIVE

## 2024-02-08 LAB — LIPID PANEL
Cholesterol: 149 mg/dL (ref <200)
HDL: 51 mg/dL (ref >=40)
LDL Cholesterol (Calc): 80 mg/dL
Non-HDL Cholesterol (Calc): 98 mg/dL (ref <130)
Total CHOL/HDL Ratio: 2.9 (calc) (ref <5.0)
Triglycerides: 99 mg/dL (ref <150)

## 2024-02-08 LAB — HEPATITIS C ANTIBODY: Hepatitis C Ab: NONREACTIVE

## 2024-02-11 ENCOUNTER — Ambulatory Visit: Admitting: Nurse Practitioner

## 2024-05-22 ENCOUNTER — Ambulatory Visit: Payer: Medicaid Other | Admitting: Dermatology

## 2024-06-02 ENCOUNTER — Ambulatory Visit: Admitting: Dermatology

## 2024-09-18 ENCOUNTER — Ambulatory Visit (INDEPENDENT_AMBULATORY_CARE_PROVIDER_SITE_OTHER): Admitting: Nurse Practitioner

## 2024-09-18 VITALS — BP 138/78 | HR 81 | Temp 97.0°F | Ht 74.5 in | Wt 165.0 lb

## 2024-09-18 DIAGNOSIS — J0110 Acute frontal sinusitis, unspecified: Secondary | ICD-10-CM

## 2024-09-18 DIAGNOSIS — J069 Acute upper respiratory infection, unspecified: Secondary | ICD-10-CM

## 2024-09-18 MED ORDER — PROMETHAZINE-DM 6.25-15 MG/5ML PO SYRP: 5.0000 mL | ORAL_SOLUTION | Freq: Four times a day (QID) | ORAL | 0 refills | Status: AC | PRN

## 2024-09-18 MED ORDER — BENZONATATE 100 MG PO CAPS: 200.0000 mg | ORAL_CAPSULE | Freq: Two times a day (BID) | ORAL | 0 refills | Status: AC | PRN

## 2024-09-18 MED ORDER — AZITHROMYCIN 250 MG PO TABS: ORAL_TABLET | ORAL | 0 refills | Status: AC

## 2024-09-18 NOTE — Progress Notes (Signed)
 "  BP 138/78   Pulse 81   Temp (!) 97 F (36.1 C)   Ht 6' 2.5 (1.892 m)   Wt 165 lb (74.8 kg)   SpO2 97%   BMI 20.90 kg/m    Subjective:    Patient ID: Jacob Simon, male    DOB: Dec 02, 2005, 18 y.o.   MRN: [id number]  HPI: Jacob Simon is a 19 y.o. male  Chief Complaint  Patient presents with   Cough    Pt c/o cough, sore throat, runny nose x 5 days.    Discussed the use of AI scribe software for clinical note transcription with the patient, who gave verbal consent to proceed.  History of Present Illness Jacob Simon is a 19 year old male who presents with upper respiratory infection symptoms for five days.  Upper respiratory symptoms - Runny nose for five days - Sore throat for five days - Cough for five days, worse at night when lying down, improved when standing - Nasal discharge is dark green to brown with blood present  Systemic symptoms - Possible fever last night, unsure of temperature - Woke up in a 'puddle of sweat' this morning  Symptom management - Using DayQuil and NyQuil without relief  Exposure history - Recently returned from a snowboarding trip - Exposure to coworkers who had been sick over Christmas - No known recent exposure to influenza         02/07/2024    2:20 PM  Depression screen PHQ 2/9  Decreased Interest 0  Down, Depressed, Hopeless 0  PHQ - 2 Score 0  Altered sleeping 0  Tired, decreased energy 0  Change in appetite 0  Feeling bad or failure about yourself  0  Trouble concentrating 0  Moving slowly or fidgety/restless 0  Suicidal thoughts 0  PHQ-9 Score 0   Difficult doing work/chores Not difficult at all     Data saved with a previous flowsheet row definition    Relevant past medical, surgical, family and social history reviewed and updated as indicated. Interim medical history since our last visit reviewed. Allergies and medications reviewed and updated.  Review of Systems  Ten systems reviewed and is negative except as  mentioned in HPI      Objective:      BP 138/78   Pulse 81   Temp (!) 97 F (36.1 C)   Ht 6' 2.5 (1.892 m)   Wt 165 lb (74.8 kg)   SpO2 97%   BMI 20.90 kg/m    Wt Readings from Last 3 Encounters:  09/18/24 165 lb (74.8 kg)  02/07/24 168 lb 11.2 oz (76.5 kg)  09/25/23 160 lb 0.9 oz (72.6 kg) (58%, Z= 0.19)*   * Growth percentiles are based on CDC (Boys, 2-20 Years) data.    Physical Exam GENERAL: Alert, cooperative, well developed, no acute distress. HEENT: Normocephalic, normal oropharynx, moist mucous membranes, no sinus tenderness. CHEST: Clear to auscultation bilaterally, no wheezes, rhonchi, or crackles. CARDIOVASCULAR: Normal heart rate and rhythm, S1 and S2 normal without murmurs. ABDOMEN: Soft, non-tender, non-distended, without organomegaly, normal bowel sounds. EXTREMITIES: No cyanosis or edema. NEUROLOGICAL: Cranial nerves grossly intact, moves all extremities without gross motor or sensory deficit.  Results for orders placed or performed in visit on 02/07/24  CBC with Differential/Platelet   Collection Time: 02/07/24  2:36 PM  Result Value Ref Range   WBC 4.6 3.8 - 10.8 Thousand/uL   RBC 5.16 4.20 - 5.80 Million/uL   Hemoglobin 15.2 13.2 -  17.1 g/dL   HCT 54.3 61.4 - 49.9 %   MCV 88.4 80.0 - 100.0 fL   MCH 29.5 27.0 - 33.0 pg   MCHC 33.3 32.0 - 36.0 g/dL   RDW 87.4 88.9 - 84.9 %   Platelets 234 140 - 400 Thousand/uL   MPV 9.5 7.5 - 12.5 fL   Neutro Abs 2,185 1,500 - 7,800 cells/uL   Absolute Lymphocytes 2,001 850 - 3,900 cells/uL   Absolute Monocytes 331 200 - 950 cells/uL   Eosinophils Absolute 51 15 - 500 cells/uL   Basophils Absolute 32 0 - 200 cells/uL   Neutrophils Relative % 47.5 %   Total Lymphocyte 43.5 %   Monocytes Relative 7.2 %   Eosinophils Relative 1.1 %   Basophils Relative 0.7 %  Comprehensive metabolic panel with GFR   Collection Time: 02/07/24  2:36 PM  Result Value Ref Range   Glucose, Bld 85 65 - 99 mg/dL   BUN 10 7 - 25  mg/dL   Creat 8.96 9.39 - 8.75 mg/dL   eGFR 892 > OR = 60 fO/fpw/8.26f7   BUN/Creatinine Ratio SEE NOTE: 6 - 22 (calc)   Sodium 139 135 - 146 mmol/L   Potassium 4.3 3.5 - 5.3 mmol/L   Chloride 103 98 - 110 mmol/L   CO2 26 20 - 32 mmol/L   Calcium 9.6 8.6 - 10.3 mg/dL   Total Protein 7.0 6.1 - 8.1 g/dL   Albumin 5.0 3.6 - 5.1 g/dL   Globulin 2.0 1.9 - 3.7 g/dL (calc)   AG Ratio 2.5 1.0 - 2.5 (calc)   Total Bilirubin 0.9 0.2 - 1.2 mg/dL   Alkaline phosphatase (APISO) 51 36 - 130 U/L   AST 18 10 - 40 U/L   ALT 12 9 - 46 U/L  Lipid panel   Collection Time: 02/07/24  2:36 PM  Result Value Ref Range   Cholesterol 149 <200 mg/dL   HDL 51 > OR = 40 mg/dL   Triglycerides 99 <849 mg/dL   LDL Cholesterol (Calc) 80 mg/dL (calc)   Total CHOL/HDL Ratio 2.9 <5.0 (calc)   Non-HDL Cholesterol (Calc) 98 <869 mg/dL (calc)  Hemoglobin J8r   Collection Time: 02/07/24  2:36 PM  Result Value Ref Range   Hgb A1c MFr Bld 5.2 <5.7 %   Mean Plasma Glucose 103 mg/dL   eAG (mmol/L) 5.7 mmol/L  Hepatitis C antibody   Collection Time: 02/07/24  2:36 PM  Result Value Ref Range   Hepatitis C Ab NON-REACTIVE NON-REACTIVE  HIV Antibody (routine testing w rflx)   Collection Time: 02/07/24  2:36 PM  Result Value Ref Range   HIV 1&2 Ab, 4th Generation NON-REACTIVE NON-REACTIVE          Assessment & Plan:   Problem List Items Addressed This Visit   None Visit Diagnoses       Viral upper respiratory tract infection    -  Primary   Relevant Medications   promethazine -dextromethorphan (PROMETHAZINE -DM) 6.25-15 MG/5ML syrup   benzonatate  (TESSALON ) 100 MG capsule   azithromycin  (ZITHROMAX ) 250 MG tablet     Acute non-recurrent frontal sinusitis       Relevant Medications   promethazine -dextromethorphan (PROMETHAZINE -DM) 6.25-15 MG/5ML syrup   benzonatate  (TESSALON ) 100 MG capsule   azithromycin  (ZITHROMAX ) 250 MG tablet        Assessment and Plan Assessment & Plan Acute upper respiratory  infection Symptoms for five days, including rhinorrhea, pharyngitis, and cough. Cough is more pronounced when supine. No significant  relief from DayQuil and NyQuil. Possible fever indicated by nocturnal diaphoresis. - Prescribed phenergan  dm and tessalon  perls for cough - Prescribed an additional cough medicine with a different mechanism of action. - Advised to maintain hydration. Recommend taking zyrtec, flonase, mucinex, vitamin d, vitamin c, and zinc. Push fluids and get rest.     Acute frontal sinusitis Symptoms include dark green to brown nasal discharge and epistaxis. No significant pain on palpation. Symptoms have not improved with over-the-counter medications. - Prescribed azithromycin  for sinus infection. - Advised to monitor symptoms and report back in a couple of days.        Follow up plan: Return if symptoms worsen or fail to improve. "

## 2025-02-09 ENCOUNTER — Encounter: Admitting: Nurse Practitioner
